# Patient Record
Sex: Male | Born: 1980 | Race: Black or African American | Hispanic: No | Marital: Single | Smoking: Never smoker
Health system: Southern US, Community
[De-identification: ages and names within clinical notes are randomized; demographics above are authoritative.]

## PROBLEM LIST (undated history)

## (undated) DIAGNOSIS — F329 Major depressive disorder, single episode, unspecified: Secondary | ICD-10-CM

## (undated) DIAGNOSIS — G4733 Obstructive sleep apnea (adult) (pediatric): Secondary | ICD-10-CM

## (undated) DIAGNOSIS — Z6841 Body Mass Index (BMI) 40.0 and over, adult: Secondary | ICD-10-CM

## (undated) DIAGNOSIS — M25569 Pain in unspecified knee: Secondary | ICD-10-CM

## (undated) DIAGNOSIS — R030 Elevated blood-pressure reading, without diagnosis of hypertension: Secondary | ICD-10-CM

## (undated) HISTORY — DX: Obstructive sleep apnea (adult) (pediatric): G47.33

## (undated) HISTORY — DX: Pain in unspecified knee: M25.569

## (undated) HISTORY — DX: Body Mass Index (BMI) 40.0 and over, adult: Z684

## (undated) HISTORY — DX: Major depressive disorder, single episode, unspecified: F32.9

## (undated) HISTORY — DX: Morbid (severe) obesity due to excess calories: E66.01

## (undated) HISTORY — DX: Elevated blood-pressure reading, without diagnosis of hypertension: R03.0

---

## 1999-12-15 ENCOUNTER — Encounter: Payer: Self-pay | Admitting: Emergency Medicine

## 1999-12-15 ENCOUNTER — Emergency Department (HOSPITAL_COMMUNITY): Admission: EM | Admit: 1999-12-15 | Discharge: 1999-12-15 | Payer: Self-pay | Admitting: Emergency Medicine

## 2003-05-26 ENCOUNTER — Emergency Department (HOSPITAL_COMMUNITY): Admission: EM | Admit: 2003-05-26 | Discharge: 2003-05-26 | Payer: Self-pay | Admitting: Emergency Medicine

## 2006-10-23 ENCOUNTER — Emergency Department (HOSPITAL_COMMUNITY): Admission: EM | Admit: 2006-10-23 | Discharge: 2006-10-23 | Payer: Self-pay | Admitting: Emergency Medicine

## 2010-10-13 LAB — I-STAT 8, (EC8 V) (CONVERTED LAB)
Chloride: 105
Glucose, Bld: 109 — ABNORMAL HIGH
Potassium: 3.9
pH, Ven: 7.347 — ABNORMAL HIGH

## 2010-10-13 LAB — POCT CARDIAC MARKERS
Myoglobin, poc: 199
Operator id: 151321

## 2010-10-13 LAB — DIFFERENTIAL
Basophils Absolute: 0
Basophils Relative: 1
Lymphocytes Relative: 38
Monocytes Relative: 8
Neutro Abs: 2.7
Neutrophils Relative %: 52

## 2010-10-13 LAB — CBC
HCT: 41.6
Hemoglobin: 13.9
MCHC: 33.4
MCV: 82.8
Platelets: 203
RBC: 5.02
RDW: 13.9
WBC: 5.1

## 2010-10-13 LAB — POCT I-STAT CREATININE: Operator id: 151321

## 2011-08-12 ENCOUNTER — Emergency Department (HOSPITAL_COMMUNITY)
Admission: EM | Admit: 2011-08-12 | Discharge: 2011-08-12 | Disposition: A | Discharge status: Home / Self Care | Payer: PRIVATE HEALTH INSURANCE | Attending: Emergency Medicine | Admitting: Emergency Medicine

## 2011-08-12 ENCOUNTER — Emergency Department (HOSPITAL_COMMUNITY): Payer: PRIVATE HEALTH INSURANCE

## 2011-08-12 ENCOUNTER — Encounter (HOSPITAL_COMMUNITY): Payer: Self-pay | Admitting: Emergency Medicine

## 2011-08-12 DIAGNOSIS — R9431 Abnormal electrocardiogram [ECG] [EKG]: Secondary | ICD-10-CM | POA: Insufficient documentation

## 2011-08-12 DIAGNOSIS — R079 Chest pain, unspecified: Secondary | ICD-10-CM | POA: Insufficient documentation

## 2011-08-12 DIAGNOSIS — R0602 Shortness of breath: Secondary | ICD-10-CM | POA: Insufficient documentation

## 2011-08-12 DIAGNOSIS — M7989 Other specified soft tissue disorders: Secondary | ICD-10-CM | POA: Insufficient documentation

## 2011-08-12 LAB — CBC
HCT: 42.1 % (ref 39.0–52.0)
Hemoglobin: 13.6 g/dL (ref 13.0–17.0)
WBC: 6.5 10*3/uL (ref 4.0–10.5)

## 2011-08-12 LAB — BASIC METABOLIC PANEL
BUN: 13 mg/dL (ref 6–23)
Chloride: 105 mEq/L (ref 96–112)
Glucose, Bld: 98 mg/dL (ref 70–99)
Potassium: 3.6 mEq/L (ref 3.5–5.1)

## 2011-08-12 LAB — HEPATIC FUNCTION PANEL
AST: 31 U/L (ref 0–37)
Albumin: 4.2 g/dL (ref 3.5–5.2)
Alkaline Phosphatase: 61 U/L (ref 39–117)
Total Bilirubin: 0.3 mg/dL (ref 0.3–1.2)

## 2011-08-12 LAB — POCT I-STAT TROPONIN I: Troponin i, poc: 0.01 ng/mL (ref 0.00–0.08)

## 2011-08-12 NOTE — ED Provider Notes (Signed)
History     CSN: [id number]  Arrival date & time 08/12/11  4696   First MD Initiated Contact with Patient 08/12/11 1049      Chief Complaint  Patient presents with  . Chest Pain  . Leg Swelling    (Consider location/radiation/quality/duration/timing/severity/associated sxs/prior treatment) HPI Comments: Wray Goehring 31 y.o. male   The chief complaint is: Patient presents with: Leg Swelling Left shoulder pain   Morbidly obese 31 y/o male with history of DM presents today with CC leg swelling. States that he was at work yesterday and nurse noticed he had leg swelling. She told him to get it checked out because "it could be a sign of congestive HF." He could not get in to see his primary care doc and came to the ED for evaluation. He states that he is at his heaviest right now after loosing over 100 pounds and attributed not fitting into his socks with his weight gain. He states that he has has noticed some SOB but it occurs when he is active at work.  He denies sudden onset SOB, he denies CP or pressure. He denies N/V/ or diaphoresis.  He states that yesterday he had four episodes of sharp pain in his left neck and shoulder, but they only lasted for 10-20 seconds.  He denies fever, diarrhea, shakes, chills, abdominal pain, headaches, visual changes, current symptoms of CP or SOB. He denies orthopnea.   Patient is a 31 y.o. male presenting with chest pain. The history is provided by the patient, a relative and medical records.  Chest Pain Pertinent negatives for primary symptoms include no fever, no shortness of breath, no cough, no wheezing, no palpitations, no abdominal pain, no nausea and no vomiting.     Past Medical History  Diagnosis Date  . Diabetes mellitus     History reviewed. No pertinent past surgical history.  History reviewed. No pertinent family history.  History  Substance Use Topics  . Smoking status: Never Smoker   . Smokeless tobacco: Not on file  .  Alcohol Use: No      Review of Systems  Constitutional: Negative for fever and chills.  HENT: Negative for neck pain and neck stiffness.   Eyes: Negative for visual disturbance.  Respiratory: Negative for cough, chest tightness, shortness of breath and wheezing.   Cardiovascular: Positive for leg swelling. Negative for chest pain and palpitations.  Gastrointestinal: Negative for nausea, vomiting, abdominal pain and diarrhea.  Genitourinary: Negative for dysuria.  Musculoskeletal: Negative for back pain.  Neurological: Negative for headaches.  All other systems reviewed and are negative.    Allergies  Review of patient's allergies indicates no known allergies.  Home Medications  No current outpatient prescriptions on file.  BP 137/75  Pulse 83  Temp 98.2 F (36.8 C) (Oral)  Resp 23  SpO2 99%  Physical Exam  Nursing note and vitals reviewed. Constitutional: He is oriented to person, place, and time. He appears well-developed. No distress.  HENT:  Head: Normocephalic and atraumatic.  Eyes: Conjunctivae are normal. No scleral icterus.  Neck: Normal range of motion. No JVD present.  Cardiovascular: Normal rate and regular rhythm.  Exam reveals no gallop and no friction rub.   No murmur heard. Pulmonary/Chest: Effort normal and breath sounds normal. No respiratory distress. He has no rales. He exhibits no tenderness.  Abdominal: Soft. Bowel sounds are normal. He exhibits no distension and no mass. There is no tenderness. There is no guarding.  Musculoskeletal: Normal range of  motion. He exhibits no edema.       No edema present in ankles.  Lymphadenopathy:    He has no cervical adenopathy.  Neurological: He is alert and oriented to person, place, and time.  Skin: Skin is warm and dry. He is not diaphoretic.    ED Course  Procedures (including critical care time)  Results for orders placed during the hospital encounter of 08/12/11  CBC      Component Value Range    WBC 6.5  4.0 - 10.5 K/uL   RBC 5.03  4.22 - 5.81 MIL/uL   Hemoglobin 13.6  13.0 - 17.0 g/dL   HCT 16.1  09.6 - 04.5 %   MCV 83.7  78.0 - 100.0 fL   MCH 27.0  26.0 - 34.0 pg   MCHC 32.3  30.0 - 36.0 g/dL   RDW 40.9  81.1 - 91.4 %   Platelets 164  150 - 400 K/uL  BASIC METABOLIC PANEL      Component Value Range   Sodium 142  135 - 145 mEq/L   Potassium 3.6  3.5 - 5.1 mEq/L   Chloride 105  96 - 112 mEq/L   CO2 26  19 - 32 mEq/L   Glucose, Bld 98  70 - 99 mg/dL   BUN 13  6 - 23 mg/dL   Creatinine, Ser 7.82  0.50 - 1.35 mg/dL   Calcium 9.5  8.4 - 95.6 mg/dL   GFR calc non Af Amer >90  >90 mL/min   GFR calc Af Amer >90  >90 mL/min  D-DIMER, QUANTITATIVE      Component Value Range   D-Dimer, Quant 0.32  0.00 - 0.48 ug/mL-FEU  HEPATIC FUNCTION PANEL      Component Value Range   Total Protein 7.8  6.0 - 8.3 g/dL   Albumin 4.2  3.5 - 5.2 g/dL   AST 31  0 - 37 U/L   ALT 31  0 - 53 U/L   Alkaline Phosphatase 61  39 - 117 U/L   Total Bilirubin 0.3  0.3 - 1.2 mg/dL   Bilirubin, Direct <2.1  0.0 - 0.3 mg/dL   Indirect Bilirubin NOT CALCULATED  0.3 - 0.9 mg/dL  LIPASE, BLOOD      Component Value Range   Lipase 22  11 - 59 U/L  POCT I-STAT TROPONIN I      Component Value Range   Troponin i, poc 0.01  0.00 - 0.08 ng/mL   Comment 3             Dg Chest 2 View  08/12/2011  *RADIOLOGY REPORT*  Clinical Data: Chest pain, leg swelling, shortness of breath.  CHEST - 2 VIEW  Comparison: 10/23/2006  Findings: Heart and mediastinal contours are within normal limits. No focal opacities or effusions.  No acute bony abnormality.  IMPRESSION: No active cardiopulmonary disease.  Original Report Authenticated By: Cyndie Chime, M.D.   12:23 PM  Patient seen and evaluated . Our old EKG was form 2008. Called [facility] who faxed in newer EKG from Feb 2012.     Date: 08/12/2011  Rate:81  Rhythm: normal sinus rhythm  QRS Axis: normal  Intervals: normal  ST/T Wave abnormalities:  nonspecific T wave changes  Conduction Disutrbances:none  Narrative Interpretation: Non specific T wave inversions lateral leads, LVH  Old EKG Reviewed: unchanged and Since EKG 2012 [facility]   1. Morbid obesity   2. Abnormal EKG  MDM  Labs normal. D-dimer negative. Leg swelling complain was BL  Not UL, No sudden onset SOB/ cough or CP.  Troponin negative and EKG unchanged. D/C patient home for f/u with PCP and cardiology. Discussed reasons to seek immediate care. Patient expresses understanding and agrees with plan.         Arthor Captain, PA-C 08/12/11 1308

## 2011-08-12 NOTE — ED Notes (Signed)
Pt c/o LE edema x 1 week with CP into neck x 3 days; pt sts some SOB

## 2011-08-12 NOTE — ED Notes (Signed)
Patient transported to X-ray 

## 2011-08-15 NOTE — ED Provider Notes (Signed)
History/physical exam/procedure(s) were performed by non-physician practitioner and as supervising physician I was immediately available for consultation/collaboration. I have reviewed all notes and am in agreement with care and plan.   Hilario Quarry, MD 08/15/11 [phone]

## 2011-09-17 ENCOUNTER — Emergency Department (HOSPITAL_COMMUNITY)
Admission: EM | Admit: 2011-09-17 | Discharge: 2011-09-17 | Disposition: A | Discharge status: Home / Self Care | Payer: Worker's Compensation | Attending: Emergency Medicine | Admitting: Emergency Medicine

## 2011-09-17 ENCOUNTER — Encounter (HOSPITAL_COMMUNITY): Payer: Self-pay | Admitting: Family Medicine

## 2011-09-17 DIAGNOSIS — S239XXA Sprain of unspecified parts of thorax, initial encounter: Secondary | ICD-10-CM | POA: Insufficient documentation

## 2011-09-17 DIAGNOSIS — X500XXA Overexertion from strenuous movement or load, initial encounter: Secondary | ICD-10-CM | POA: Insufficient documentation

## 2011-09-17 DIAGNOSIS — S29012A Strain of muscle and tendon of back wall of thorax, initial encounter: Secondary | ICD-10-CM

## 2011-09-17 DIAGNOSIS — E119 Type 2 diabetes mellitus without complications: Secondary | ICD-10-CM | POA: Insufficient documentation

## 2011-09-17 DIAGNOSIS — M62838 Other muscle spasm: Secondary | ICD-10-CM

## 2011-09-17 MED ORDER — NAPROXEN 500 MG PO TABS: 500.0000 mg | ORAL_TABLET | Freq: Two times a day (BID) | ORAL | Status: AC

## 2011-09-17 MED ORDER — METHOCARBAMOL 500 MG PO TABS: 1000.0000 mg | ORAL_TABLET | Freq: Four times a day (QID) | ORAL | Status: AC

## 2011-09-17 MED ORDER — HYDROCODONE-ACETAMINOPHEN 5-325 MG PO TABS: ORAL_TABLET | ORAL | Status: AC

## 2011-09-17 NOTE — ED Provider Notes (Signed)
History     CSN: [id number]  Arrival date & time 09/17/11  4098   First MD Initiated Contact with Patient 09/17/11 [phone]      Chief Complaint  Patient presents with  . Back Pain    (Consider location/radiation/quality/duration/timing/severity/associated sxs/prior treatment) HPI Comments: Patient presents with the chief complaint of back pain. 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 He states the pain is worse with movement and lifting his arms. He has not taken anything for the pain. He denies numbness or tingling in his hands, headache, blurry vision and shortness of breath. He denies history of back problems. Onset was acute. Course is constant. Pain is made worse with movement. Nothing is making it better.  Patient is a 31 y.o. male presenting with back pain. The history is provided by the patient.  Back Pain  This is a new problem. The current episode started 6 to 12 hours ago. The problem has not changed since onset.The pain is associated with lifting heavy objects (lifting patient). The pain is present in the thoracic spine. The pain does not radiate. Pertinent negatives include no fever, no numbness, no headaches, no tingling and no weakness.    Past Medical History  Diagnosis Date  . Diabetes mellitus     History reviewed. No pertinent past surgical history.  History reviewed. No pertinent family history.  History  Substance Use Topics  . Smoking status: Never Smoker   . Smokeless tobacco: Not on file  . Alcohol Use: No      Review of Systems  Constitutional: Negative for fever, fatigue and unexpected weight change.  HENT: Negative for neck pain and neck stiffness.   Eyes: Negative for visual disturbance.  Respiratory: Negative for shortness of breath.   Gastrointestinal: Negative for nausea, vomiting and constipation.       Neg for fecal incontinence    Genitourinary: Negative for hematuria, flank pain and difficulty urinating.       Negative for urinary incontinence or retention  Musculoskeletal: Positive for back pain.  Skin: Negative for color change.  Neurological: Negative for tingling, weakness, numbness and headaches.       Negative for saddle paresthesias     Allergies  Review of patient's allergies indicates no known allergies.  Home Medications  No current outpatient prescriptions on file.  BP 130/76  Pulse 76  Temp 98 F (36.7 C)  Resp 18  SpO2 97%  Physical Exam  Nursing note and vitals reviewed. Constitutional: He appears well-developed and well-nourished.       In no acute distress  HENT:  Head: Normocephalic and atraumatic.  Eyes: Conjunctivae normal are normal.  Neck: Normal range of motion. Neck supple.  Cardiovascular: Normal rate, regular rhythm and normal heart sounds.   No murmur heard. Pulmonary/Chest: Effort normal and breath sounds normal. No respiratory distress.  Abdominal: Soft. There is no tenderness. There is no CVA tenderness.  Musculoskeletal: Normal range of motion. He exhibits tenderness.       Right shoulder: Normal.       Left shoulder: Normal.       Cervical back: Normal. He exhibits normal range of motion, no tenderness and no bony tenderness.       Thoracic back: He exhibits tenderness (upper). He exhibits normal range of motion and no bony tenderness.       Lumbar back: Normal. He exhibits  normal range of motion, no tenderness and no bony tenderness.       Back:  Neurological: He is alert. He has normal reflexes. No sensory deficit. He exhibits normal muscle tone.       5/5 strength in entire lower extremities bilaterally. No sensation deficit.   Skin: Skin is warm and dry.  Psychiatric: He has a normal mood and affect.    ED Course  Procedures (including critical care time)  Labs Reviewed - No data to display No results found.   1. Muscle spasm   2. Upper back strain      10:27 AM Patient seen and examined.    Vital signs reviewed and are as follows: Filed Vitals:   09/17/11 1001  BP: 154/74  Pulse: 77  Temp:   Resp: 18   Patient with upper back pain and spasm. No red flag s/s of low back pain. Patient was counseled on back pain precautions and told to do activity as tolerated but do not lift, push, or pull heavy objects more than 10 pounds for the next week.  Patient counseled to use ice or heat on back for no longer than 15 minutes every hour.   Patient prescribed muscle relaxer and counseled on proper use of muscle relaxant medication.    Patient prescribed narcotic pain medicine and counseled on proper use of narcotic pain medications. Counseled not to combine this medication with others containing tylenol.   Urged patient not to drink alcohol, drive, or perform any other activities that requires focus while taking either of these medications.  Patient urged to follow-up with PCP if pain does not improve with treatment and rest or if pain becomes recurrent. Urged to return with worsening severe pain, loss of bowel or bladder control, trouble walking.   The patient verbalizes understanding and agrees with the plan.   MDM  Patient with back pain. No neurological deficits. Patient is ambulatory. Pain is upper, but no warning symptoms of back pain including: loss of bowel or bladder control, night sweats, waking from sleep with back pain, unexplained fevers or weight loss, h/o cancer, IVDU, recent trauma. Normal UE neuro exam. No concern for cauda equina, epidural abscess, or other serious cause of back pain. Conservative measures such as rest, ice/heat and pain medicine indicated with PCP follow-up if no improvement with conservative management.          Renne Crigler, [place] 09/17/11 1031

## 2011-09-17 NOTE — ED Notes (Signed)
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

## 2011-09-17 NOTE — ED Provider Notes (Signed)
Medical screening examination/treatment/procedure(s) were performed by non-physician practitioner and as supervising physician I was immediately available for consultation/collaboration.  Cameshia Cressman, MD 09/17/11 1622 

## 2013-06-07 ENCOUNTER — Other Ambulatory Visit (HOSPITAL_COMMUNITY): Payer: Self-pay | Admitting: Internal Medicine

## 2013-06-07 ENCOUNTER — Ambulatory Visit (HOSPITAL_COMMUNITY)
Admission: RE | Admit: 2013-06-07 | Discharge: 2013-06-07 | Disposition: A | Discharge status: Home / Self Care | Payer: PRIVATE HEALTH INSURANCE | Source: Ambulatory Visit | Attending: Internal Medicine | Admitting: Internal Medicine

## 2013-06-07 ENCOUNTER — Ambulatory Visit (HOSPITAL_COMMUNITY)
Admission: RE | Admit: 2013-06-07 | Discharge: 2013-06-07 | Disposition: A | Discharge status: Home / Self Care | Payer: PRIVATE HEALTH INSURANCE | Source: Other Acute Inpatient Hospital | Attending: Nurse Practitioner | Admitting: Nurse Practitioner

## 2013-06-07 DIAGNOSIS — M25562 Pain in left knee: Secondary | ICD-10-CM

## 2013-06-07 DIAGNOSIS — M25662 Stiffness of left knee, not elsewhere classified: Secondary | ICD-10-CM

## 2013-06-07 DIAGNOSIS — M259 Joint disorder, unspecified: Secondary | ICD-10-CM | POA: Insufficient documentation

## 2013-07-23 ENCOUNTER — Encounter: Payer: Self-pay | Admitting: *Deleted

## 2013-07-24 ENCOUNTER — Institutional Professional Consult (permissible substitution): Payer: Self-pay | Admitting: Neurology

## 2014-12-08 ENCOUNTER — Emergency Department (INDEPENDENT_AMBULATORY_CARE_PROVIDER_SITE_OTHER)
Admission: EM | Admit: 2014-12-08 | Discharge: 2014-12-08 | Disposition: A | Discharge status: Home / Self Care | Payer: Self-pay | Source: Home / Self Care

## 2014-12-08 ENCOUNTER — Emergency Department (INDEPENDENT_AMBULATORY_CARE_PROVIDER_SITE_OTHER): Payer: Worker's Compensation

## 2014-12-08 ENCOUNTER — Encounter (HOSPITAL_COMMUNITY): Payer: Self-pay | Admitting: Emergency Medicine

## 2014-12-08 DIAGNOSIS — M549 Dorsalgia, unspecified: Secondary | ICD-10-CM

## 2014-12-08 MED ORDER — CYCLOBENZAPRINE HCL 10 MG PO TABS: 10.0000 mg | ORAL_TABLET | Freq: Two times a day (BID) | ORAL | Status: DC | PRN
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
## 2017-09-23 ENCOUNTER — Other Ambulatory Visit: Payer: Self-pay

## 2017-09-23 ENCOUNTER — Emergency Department (HOSPITAL_COMMUNITY)
Admission: EM | Admit: 2017-09-23 | Discharge: 2017-09-23 | Disposition: A | Discharge status: Home / Self Care | Payer: Self-pay | Attending: Emergency Medicine | Admitting: Emergency Medicine

## 2017-09-23 ENCOUNTER — Encounter (HOSPITAL_COMMUNITY): Payer: Self-pay | Admitting: Emergency Medicine

## 2017-09-23 ENCOUNTER — Emergency Department (HOSPITAL_BASED_OUTPATIENT_CLINIC_OR_DEPARTMENT_OTHER)
Admit: 2017-09-23 | Discharge: 2017-09-23 | Disposition: A | Discharge status: Home / Self Care | Payer: Self-pay | Attending: Emergency Medicine | Admitting: Emergency Medicine

## 2017-09-23 ENCOUNTER — Emergency Department (HOSPITAL_COMMUNITY): Payer: Self-pay

## 2017-09-23 DIAGNOSIS — M79605 Pain in left leg: Secondary | ICD-10-CM | POA: Insufficient documentation

## 2017-09-23 DIAGNOSIS — R609 Edema, unspecified: Secondary | ICD-10-CM

## 2017-09-23 DIAGNOSIS — E119 Type 2 diabetes mellitus without complications: Secondary | ICD-10-CM | POA: Insufficient documentation

## 2017-09-23 DIAGNOSIS — R0789 Other chest pain: Secondary | ICD-10-CM | POA: Insufficient documentation

## 2017-09-23 LAB — BASIC METABOLIC PANEL
Anion gap: 12 (ref 5–15)
BUN: 8 mg/dL (ref 6–20)
CALCIUM: 9.1 mg/dL (ref 8.9–10.3)
CHLORIDE: 101 mmol/L (ref 98–111)
CO2: 25 mmol/L (ref 22–32)
Creatinine, Ser: 0.89 mg/dL (ref 0.61–1.24)
Glucose, Bld: 135 mg/dL — ABNORMAL HIGH (ref 70–99)
Potassium: 4.2 mmol/L (ref 3.5–5.1)
Sodium: 138 mmol/L (ref 135–145)

## 2017-09-23 LAB — I-STAT TROPONIN, ED
TROPONIN I, POC: 0 ng/mL (ref 0.00–0.08)
TROPONIN I, POC: 0 ng/mL (ref 0.00–0.08)

## 2017-09-23 LAB — CBC
HCT: 48.3 % (ref 39.0–52.0)
Hemoglobin: 14.4 g/dL (ref 13.0–17.0)
MCH: 26.7 pg (ref 26.0–34.0)
MCHC: 29.8 g/dL — ABNORMAL LOW (ref 30.0–36.0)
MCV: 89.6 fL (ref 78.0–100.0)
PLATELETS: 164 10*3/uL (ref 150–400)
RBC: 5.39 MIL/uL (ref 4.22–5.81)
RDW: 13.5 % (ref 11.5–15.5)
WBC: 6.9 10*3/uL (ref 4.0–10.5)

## 2017-09-23 MED ORDER — CYCLOBENZAPRINE HCL 10 MG PO TABS: 10.0000 mg | ORAL_TABLET | Freq: Two times a day (BID) | ORAL | 0 refills | Status: AC | PRN

## 2017-09-23 NOTE — ED Triage Notes (Signed)
Pt. Stated, here lately Ive had some swelling in my legs. That's a big difference

## 2017-09-23 NOTE — Progress Notes (Signed)
Left lower extremity venous duplex has been completed. Negative for DVT. Results were given to Fayrene HelperBowie Tran PA.  09/23/17 11:45 AM Olen CordialGreg Ilija Maxim RVT

## 2017-09-23 NOTE — ED Provider Notes (Signed)
[facility] EMERGENCY DEPARTMENT Provider Note   CSN: [id number] Arrival date & time: 09/23/17  0755     History   Chief Complaint Chief Complaint  Patient presents with  . Chest Pain  . Depression    HPI Jimmy Wilcox is a 37 y.o. male.  The history is provided by the patient. No language interpreter was used.  Chest Pain    Depression  Associated symptoms include chest pain.     38 year old morbidly obese male with history of diabetes, hypertension, depression presenting with multiple complaints.  Patient report for the past 6 months he has been having recurrent chest pain.  He described pain as a sharp nonradiating pain to his mid chest lasting for seconds to minutes occasionally associate with some shortness of breath.  Pain sometimes improves when he clears his throat when he coughs.  Most recent episode was prior to arrival while he was driving and he was concerned.  States he has family history of cardiac disease and an uncle that died from heart attack at the age of 64s.  Chest pain is not associate with diaphoresis, nausea, or lightheadedness.  Patient mention feeling depressed because he does not have any significant family support but denies SI or HI.  Furthermore, patient also mentioned having recurrent pain to his left leg.  Pain is usually involving the left hip and occasionally down his leg with sensation of leg swelling.  This has been ongoing for the past year but he has never been evaluated for it.  He denies any prior history of PE or DVT or any recent injury.  He denies any associated numbness or back pain.  He is currently symptom-free.  Past Medical History:  Diagnosis Date  . Body mass index 60.0-69.9, adult (HCC)   . Diabetes mellitus   . Elevated blood pressure reading without diagnosis of hypertension   . Major depressive disorder, single episode, unspecified   . Morbid obesity (HCC)   . Obstructive sleep apnea (adult) (pediatric)   .  Pain in joint, lower leg     There are no active problems to display for this patient.   History reviewed. No pertinent surgical history.      Home Medications    Prior to Admission medications   Medication Sig Start Date End Date Taking? Authorizing Provider  cyclobenzaprine (FLEXERIL) 10 MG tablet Take 1 tablet (10 mg total) by mouth 2 (two) times daily as needed for muscle spasms. Patient not taking: Reported on 09/23/2017 12/08/14   Tharon Aquas, PA    Family History No family history on file.  Social History Social History   Tobacco Use  . Smoking status: Never Smoker  . Smokeless tobacco: Never Used  Substance Use Topics  . Alcohol use: No  . Drug use: No     Allergies   Patient has no known allergies.   Review of Systems Review of Systems  Cardiovascular: Positive for chest pain.  Psychiatric/Behavioral: Positive for depression.  All other systems reviewed and are negative.    Physical Exam Updated Vital Signs BP 139/83 (BP Location: Left Arm)   Pulse 62   Temp 98 F (36.7 C) (Oral)   Resp 20   Ht 6\' 2"  (1.88 m)   Wt (!) 145.2 kg   SpO2 97%   BMI 41.09 kg/m   Physical Exam  Constitutional: He appears well-developed and well-nourished. No distress.  Moderately obese male resting in bed comfortably in no acute discomfort.  HENT:  Head: Atraumatic.  Eyes: Conjunctivae are normal.  Neck: Neck supple.  Cardiovascular: Normal rate, regular rhythm, intact distal pulses and normal pulses.  Pulmonary/Chest: Effort normal and breath sounds normal. He has no decreased breath sounds.  Abdominal: Soft. There is no tenderness.  Musculoskeletal:       Right lower leg: He exhibits no edema.       Left lower leg: He exhibits no edema.  Palpation of the left lower extremity without evidence of significant edema, palpable cords, or erythema however exam is difficult due to large body habitus.  Brisk cap refill to all toes.  Neurological: He is alert.    Skin: No rash noted.  Psychiatric: He has a normal mood and affect.  Nursing note and vitals reviewed.    ED Treatments / Results  Labs (all labs ordered are listed, but only abnormal results are displayed) Labs Reviewed  BASIC METABOLIC PANEL - Abnormal; Notable for the following components:      Result Value   Glucose, Bld 135 (*)    All other components within normal limits  CBC - Abnormal; Notable for the following components:   MCHC 29.8 (*)    All other components within normal limits  I-STAT TROPONIN, ED    EKG EKG Interpretation  Date/Time:  Saturday September 23 2017 08:02:14 EDT Ventricular Rate:  62 PR Interval:  150 QRS Duration: 80 QT Interval:  416 QTC Calculation: 422 R Axis:   62 Text Interpretation:  Normal sinus rhythm ST elevation consider lateral injury or acute infarct Abnormal ECG No significant change since last tracing Confirmed by Shaune PollackIsaacs, Cameron [phone]([id number]) on 09/23/2017 10:35:21 AM     Radiology Dg Chest 2 View  Result Date: 09/23/2017 CLINICAL DATA:  Shortness of breath and chest pain. Nonsmoker. History of diabetes and elevated blood pressure. EXAM: CHEST - 2 VIEW COMPARISON:  Radiographs 08/12/2011. FINDINGS: The heart size and mediastinal contours are normal. The lungs are clear. There is no pleural effusion or pneumothorax. No acute osseous findings are identified. IMPRESSION: Stable examination.  No active cardiopulmonary process. Electronically Signed   By: Carey BullocksWilliam  Veazey M.D.   On: 09/23/2017 08:42    Procedures Procedures (including critical care time)  Signed         Show:Clear all [x] Manual[x] Template[] Copied  Added by: [x] Elsie Stainollins, Gregory J, RVT  [] Hover for details Left lower extremity venous duplex has been completed. Negative for DVT. Results were given to Fayrene HelperBowie Kaziyah Parkison PA.  09/23/17 11:45 AM Olen CordialGreg Collins RVT          Medications Ordered in ED Medications - No data to display   Initial Impression / Assessment  and Plan / ED Course  I have reviewed the triage vital signs and the nursing notes.  Pertinent labs & imaging results that were available during my care of the patient were reviewed by me and considered in my medical decision making (see chart for details).     BP 139/83 (BP Location: Left Arm)   Pulse 62   Temp 98 F (36.7 C) (Oral)   Resp 20   Ht 6\' 2"  (1.88 m)   Wt (!) 145.2 kg   SpO2 97%   BMI 41.09 kg/m    Final Clinical Impressions(s) / ED Diagnoses   Final diagnoses:  Atypical chest pain  Left leg pain    ED Discharge Orders         Ordered    cyclobenzaprine (FLEXERIL) 10 MG tablet  2 times daily PRN  09/23/17 1356         10:42 AM Patient report intermittent chest pain.  Pain is atypical of ACS however initial EKG shows T wave inversion in the inferior leads and elevation in the lateral leads.  This is unchanged from prior EKG.  Will obtain delta troponin care discussed with Dr. Erma Heritage.  Patient report intermittent left leg discomfort.  Due to large body habitus, exam is limited.  He does not have any other significant risk factor for DVT however I will obtain venous Doppler ultrasound to rule out DVT.  No evidence of infection noted on exam.  Patient report depression without SI or HI.  Will provide outpatient follow-up for further care.  At this time he is resting comfortably.  1:49 PM Normal delta troponin, low suspicion for ACS.  Left lower extremity DVT is negative.  Patient is currently resting comfortably.  Reassurance given, will provide outpatient resource for follow-up.  Return precautions discussed.   Fayrene Helper, PA-C 09/23/17 1359    Shaune Pollack, MD 09/24/17 [phone]

## 2017-09-23 NOTE — ED Triage Notes (Signed)
Pt. Stated, Im here cause Ive had some chest pain, swelling in my leg and a little depression but don't want to kill myself. This has been going on for about a year.

## 2019-06-04 IMAGING — DX DG CHEST 2V
2 series · 2 of 2 positions shown · non-contrast
Comparison: Radiographs 08/12/2011.

CLINICAL DATA: Shortness of breath and chest pain. Nonsmoker.
History of diabetes and elevated blood pressure.

EXAM:
CHEST - 2 VIEW

[chest pa]
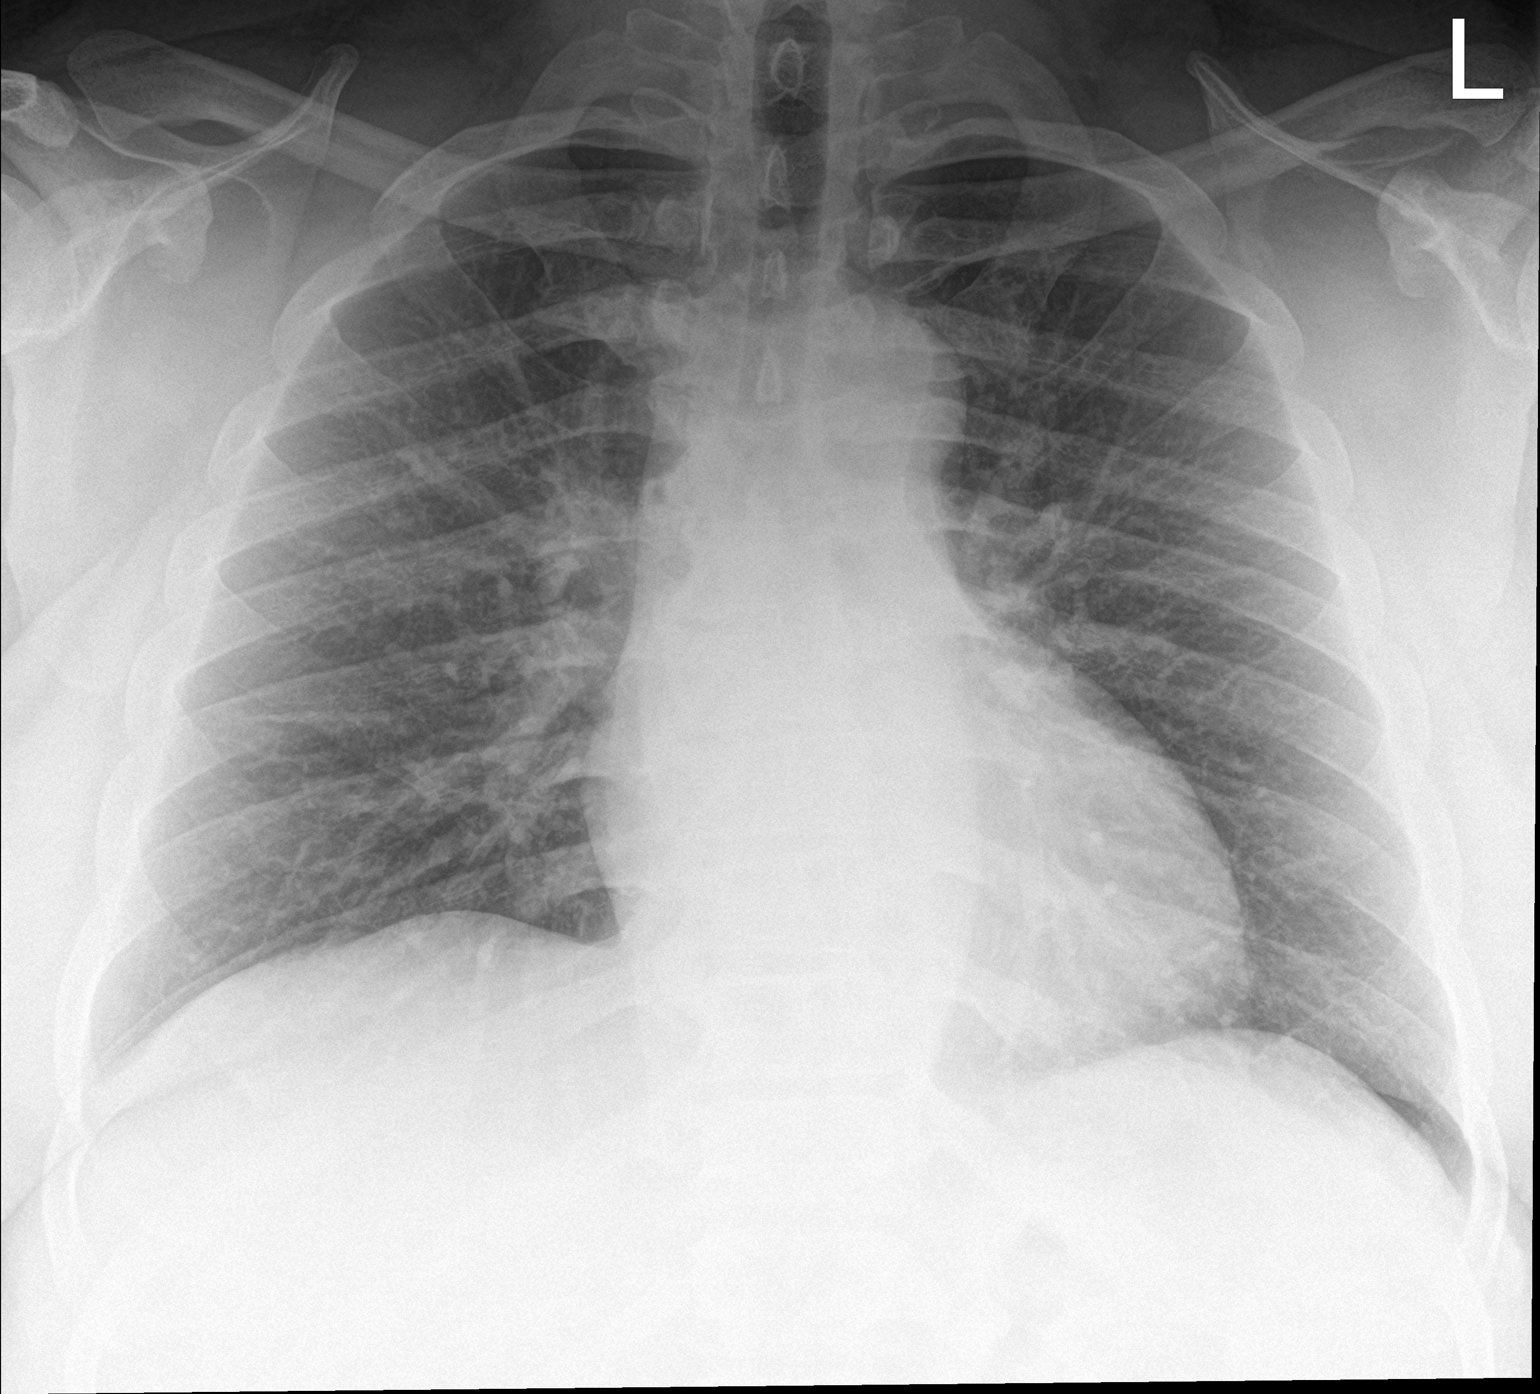

[chest lat]
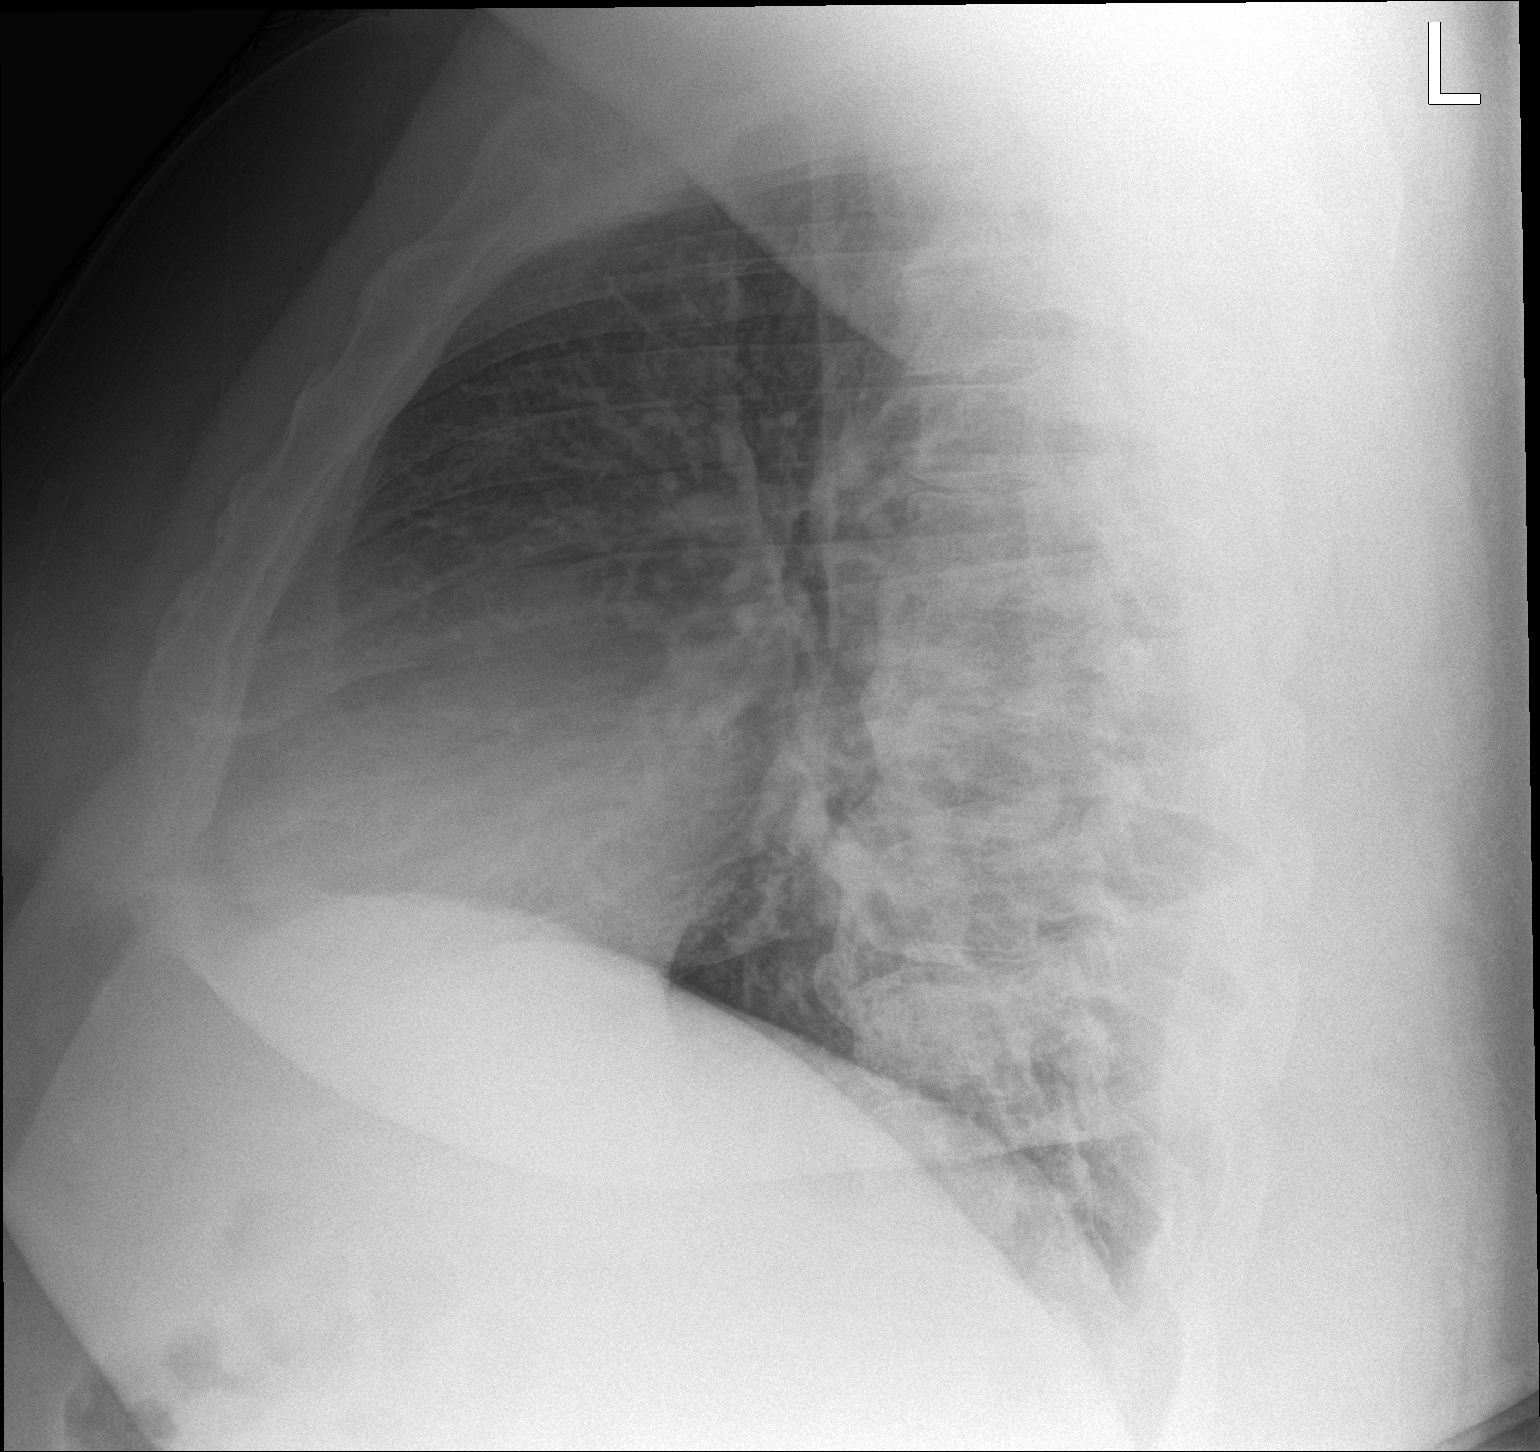

[2 of 2 positions shown; findings below may reference images not displayed]

FINDINGS: The heart size and mediastinal contours are normal. The lungs are
clear. There is no pleural effusion or pneumothorax. No acute
osseous findings are identified.
IMPRESSION: Stable examination.  No active cardiopulmonary process.
